# Patient Record
Sex: Male | Born: 1955 | Hispanic: No | Smoking: Current every day smoker
Health system: Southern US, Community
[De-identification: ages and names within clinical notes are randomized; demographics above are authoritative.]

## PROBLEM LIST (undated history)

## (undated) DIAGNOSIS — R55 Syncope and collapse: Secondary | ICD-10-CM

## (undated) HISTORY — DX: Syncope and collapse: R55

## (undated) HISTORY — PX: ANKLE SURGERY: SHX546

## (undated) HISTORY — PX: OTHER SURGICAL HISTORY: SHX169

## (undated) HISTORY — PX: CARPAL TUNNEL RELEASE: SHX101

---

## 1977-11-30 HISTORY — PX: KNEE SURGERY: SHX244

## 1993-11-30 HISTORY — PX: NECK SURGERY: SHX720

## 1998-07-02 ENCOUNTER — Ambulatory Visit (HOSPITAL_COMMUNITY): Admission: RE | Admit: 1998-07-02 | Discharge: 1998-07-02 | Payer: Self-pay | Admitting: Gastroenterology

## 1999-03-31 ENCOUNTER — Ambulatory Visit (HOSPITAL_BASED_OUTPATIENT_CLINIC_OR_DEPARTMENT_OTHER): Admission: RE | Admit: 1999-03-31 | Discharge: 1999-03-31 | Payer: Self-pay | Admitting: Orthopedic Surgery

## 1999-04-07 ENCOUNTER — Ambulatory Visit (HOSPITAL_COMMUNITY): Admission: RE | Admit: 1999-04-07 | Discharge: 1999-04-07 | Payer: Self-pay | Admitting: Orthopedic Surgery

## 2001-01-27 ENCOUNTER — Other Ambulatory Visit (HOSPITAL_COMMUNITY): Admission: RE | Admit: 2001-01-27 | Discharge: 2001-02-18 | Payer: Self-pay | Admitting: Psychiatry

## 2002-12-29 ENCOUNTER — Ambulatory Visit (HOSPITAL_COMMUNITY): Admission: RE | Admit: 2002-12-29 | Discharge: 2002-12-29 | Payer: Self-pay | Admitting: General Surgery

## 2002-12-29 ENCOUNTER — Encounter: Payer: Self-pay | Admitting: General Surgery

## 2004-04-26 ENCOUNTER — Observation Stay (HOSPITAL_COMMUNITY): Admission: EM | Admit: 2004-04-26 | Discharge: 2004-04-27 | Payer: Self-pay | Admitting: Emergency Medicine

## 2005-05-25 ENCOUNTER — Ambulatory Visit (HOSPITAL_COMMUNITY): Admission: RE | Admit: 2005-05-25 | Discharge: 2005-05-25 | Payer: Self-pay | Admitting: Specialist

## 2005-08-16 ENCOUNTER — Encounter: Admission: RE | Admit: 2005-08-16 | Discharge: 2005-08-16 | Payer: Self-pay | Admitting: Neurology

## 2005-09-29 ENCOUNTER — Encounter: Admission: RE | Admit: 2005-09-29 | Discharge: 2005-09-29 | Payer: Self-pay | Admitting: Specialist

## 2006-11-19 ENCOUNTER — Ambulatory Visit: Payer: Self-pay | Admitting: Nurse Practitioner

## 2006-11-25 ENCOUNTER — Emergency Department (HOSPITAL_COMMUNITY): Admission: EM | Admit: 2006-11-25 | Discharge: 2006-11-25 | Payer: Self-pay | Admitting: Emergency Medicine

## 2006-11-26 ENCOUNTER — Ambulatory Visit: Payer: Self-pay | Admitting: *Deleted

## 2006-12-07 ENCOUNTER — Ambulatory Visit: Payer: Self-pay | Admitting: Nurse Practitioner

## 2007-03-30 ENCOUNTER — Emergency Department (HOSPITAL_COMMUNITY): Admission: EM | Admit: 2007-03-30 | Discharge: 2007-03-30 | Payer: Self-pay | Admitting: Emergency Medicine

## 2007-08-17 ENCOUNTER — Encounter (INDEPENDENT_AMBULATORY_CARE_PROVIDER_SITE_OTHER): Payer: Self-pay | Admitting: *Deleted

## 2008-01-12 ENCOUNTER — Emergency Department (HOSPITAL_COMMUNITY): Admission: EM | Admit: 2008-01-12 | Discharge: 2008-01-12 | Payer: Self-pay | Admitting: Emergency Medicine

## 2009-02-14 IMAGING — CR DG RIBS BILAT 3V
1 series · 1 of 1 positions shown · non-contrast
Comparison: none

CLINICAL DATA: Fall. 
 CHEST AND BILATERAL RIBS ? 5 VIEW:

[w chest ap]
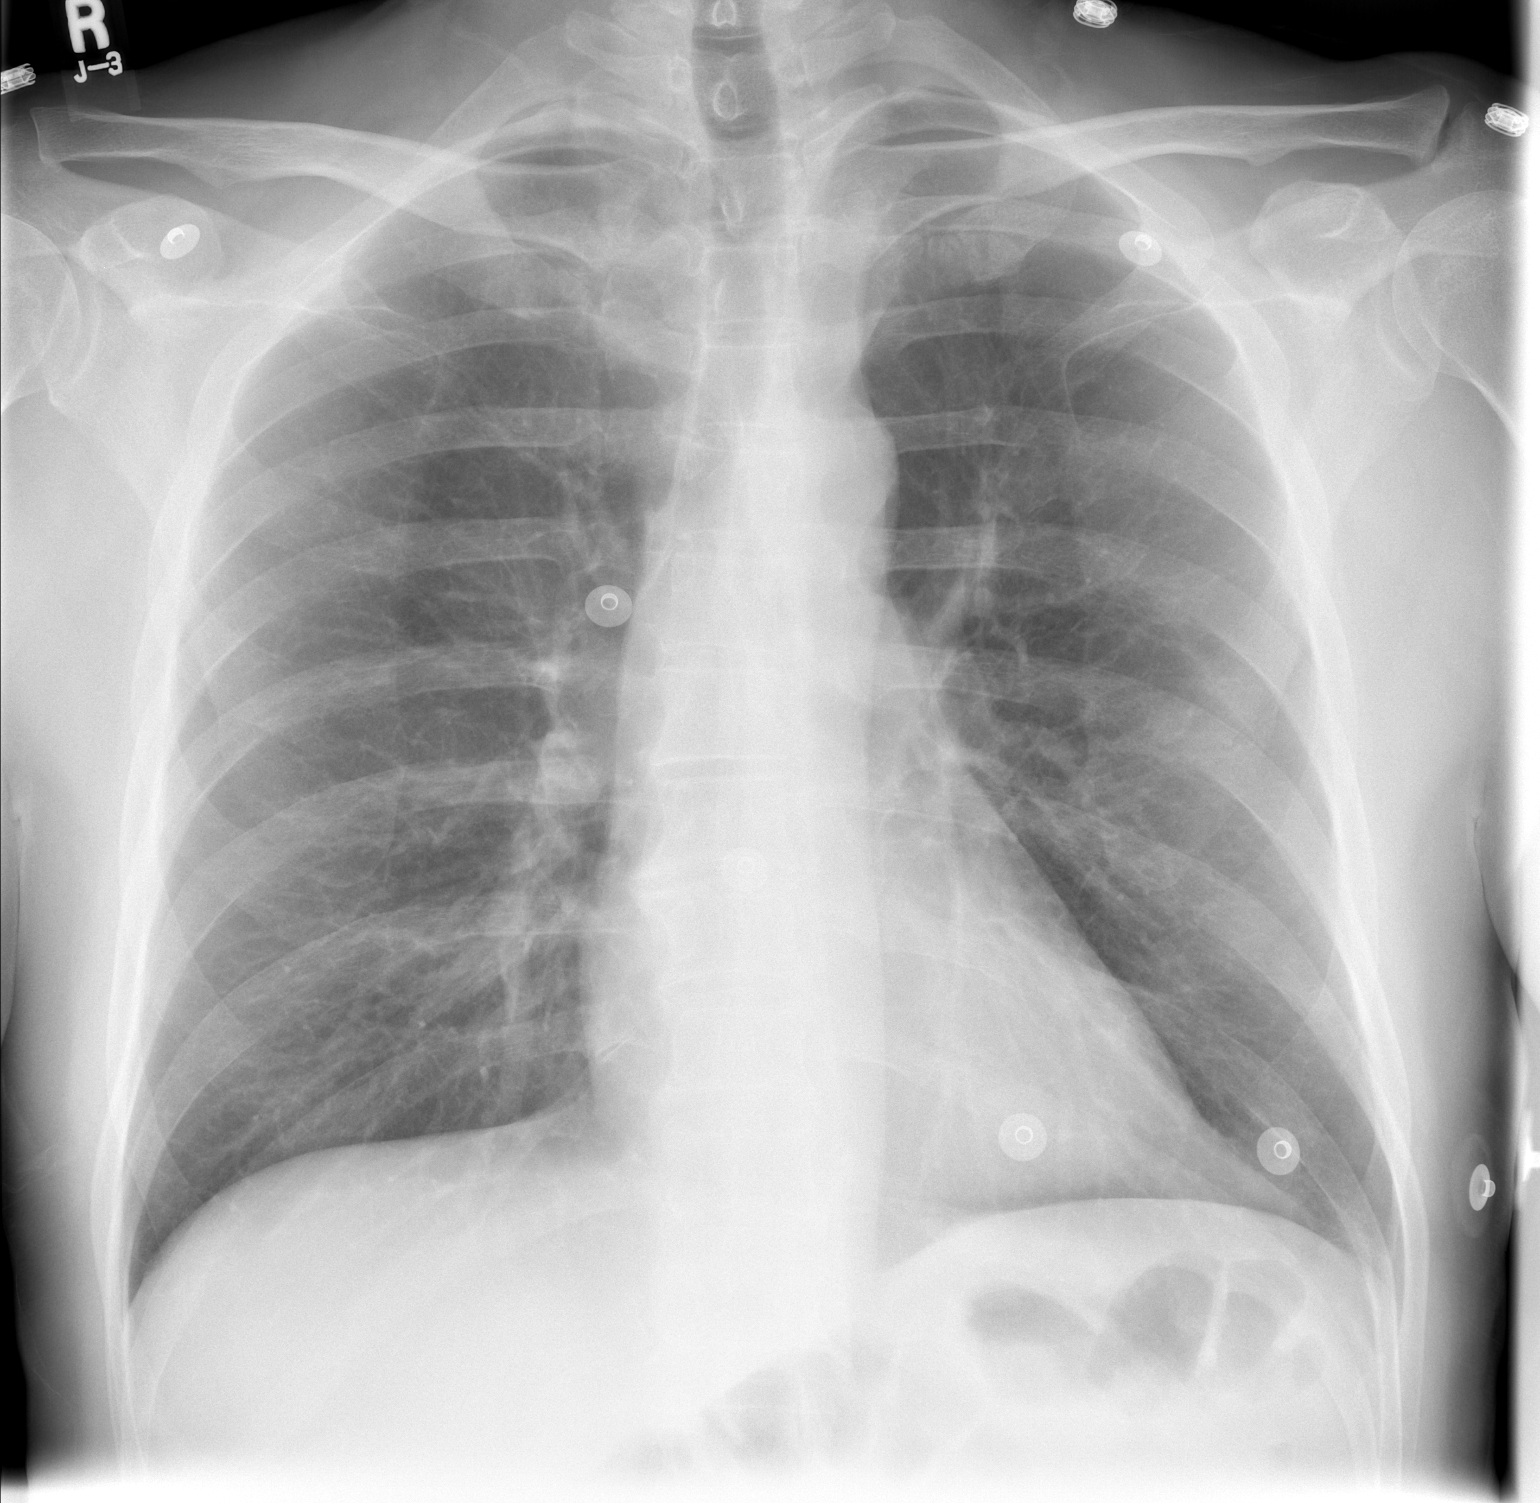

[1 of 1 positions shown; findings below may reference images not displayed]

FINDINGS: Heart size is normal.  There is mild COPD with hyperinflation and mild scarring.  The lungs are clear.  There is no effusion or pneumothorax. 
 Negative for rib fracture bilaterally.
IMPRESSION: 1.  COPD.  No acute abnormality. 
 2.  Negative for rib fracture bilaterally.

## 2009-06-11 ENCOUNTER — Emergency Department (HOSPITAL_COMMUNITY): Admission: EM | Admit: 2009-06-11 | Discharge: 2009-06-11 | Payer: Self-pay | Admitting: Emergency Medicine

## 2010-06-13 ENCOUNTER — Observation Stay (HOSPITAL_COMMUNITY)
Admission: EM | Admit: 2010-06-13 | Discharge: 2010-06-14 | Payer: Self-pay | Source: Home / Self Care | Admitting: Emergency Medicine

## 2011-02-14 LAB — URINALYSIS, ROUTINE W REFLEX MICROSCOPIC
Hgb urine dipstick: NEGATIVE
Nitrite: NEGATIVE
Urobilinogen, UA: 1 mg/dL (ref 0.0–1.0)
pH: 5.5 (ref 5.0–8.0)

## 2011-02-14 LAB — POCT I-STAT, CHEM 8
BUN: 22 mg/dL (ref 6–23)
Calcium, Ion: 1.13 mmol/L (ref 1.12–1.32)
Sodium: 140 mEq/L (ref 135–145)
TCO2: 28 mmol/L (ref 0–100)
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
## 2015-01-03 DIAGNOSIS — M79671 Pain in right foot: Secondary | ICD-10-CM | POA: Diagnosis not present

## 2015-01-03 DIAGNOSIS — Z720 Tobacco use: Secondary | ICD-10-CM | POA: Diagnosis not present

## 2018-03-23 ENCOUNTER — Emergency Department (HOSPITAL_COMMUNITY)
Admission: EM | Admit: 2018-03-23 | Discharge: 2018-03-23 | Disposition: A | Discharge status: Home / Self Care | Payer: Self-pay | Attending: Emergency Medicine | Admitting: Emergency Medicine

## 2018-03-23 ENCOUNTER — Encounter (HOSPITAL_COMMUNITY): Payer: Self-pay

## 2018-03-23 DIAGNOSIS — R531 Weakness: Secondary | ICD-10-CM | POA: Insufficient documentation

## 2018-03-23 DIAGNOSIS — R413 Other amnesia: Secondary | ICD-10-CM | POA: Insufficient documentation

## 2018-03-23 DIAGNOSIS — Z5321 Procedure and treatment not carried out due to patient leaving prior to being seen by health care provider: Secondary | ICD-10-CM | POA: Insufficient documentation

## 2018-03-23 DIAGNOSIS — R55 Syncope and collapse: Secondary | ICD-10-CM | POA: Insufficient documentation

## 2018-03-23 LAB — CBC
HCT: 46.1 % (ref 39.0–52.0)
Hemoglobin: 16.1 g/dL (ref 13.0–17.0)
MCH: 34 pg (ref 26.0–34.0)
MCHC: 34.9 g/dL (ref 30.0–36.0)
MCV: 97.3 fL (ref 78.0–100.0)
PLATELETS: 250 10*3/uL (ref 150–400)
RBC: 4.74 MIL/uL (ref 4.22–5.81)
RDW: 14.1 % (ref 11.5–15.5)
WBC: 8.4 10*3/uL (ref 4.0–10.5)

## 2018-03-23 LAB — URINALYSIS, ROUTINE W REFLEX MICROSCOPIC
BILIRUBIN URINE: NEGATIVE
Bacteria, UA: NONE SEEN
GLUCOSE, UA: NEGATIVE mg/dL
HGB URINE DIPSTICK: NEGATIVE
KETONES UR: NEGATIVE mg/dL
NITRITE: NEGATIVE
PH: 6 (ref 5.0–8.0)
Protein, ur: NEGATIVE mg/dL
SPECIFIC GRAVITY, URINE: 1.016 (ref 1.005–1.030)

## 2018-03-23 LAB — BASIC METABOLIC PANEL
Anion gap: 7 (ref 5–15)
BUN: 10 mg/dL (ref 6–20)
CHLORIDE: 107 mmol/L (ref 101–111)
CO2: 24 mmol/L (ref 22–32)
CREATININE: 0.89 mg/dL (ref 0.61–1.24)
Calcium: 9.5 mg/dL (ref 8.9–10.3)
Glucose, Bld: 111 mg/dL — ABNORMAL HIGH (ref 65–99)
Potassium: 4.1 mmol/L (ref 3.5–5.1)
SODIUM: 138 mmol/L (ref 135–145)

## 2018-03-23 LAB — CBG MONITORING, ED: Glucose-Capillary: 108 mg/dL — ABNORMAL HIGH (ref 65–99)

## 2018-03-23 NOTE — ED Notes (Signed)
CBG 108. 
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
## 2018-03-23 NOTE — ED Notes (Addendum)
This RN noted patient to be yelling at EMT in lobby.  Patient upset about MRI he states his PCP ordered for him.  This RN unable to see order at this time.  Explained to patient he is waiting for a doctor to assess him in order to be able to order an MRI (if appropriate).  Patient irate, yelling at staff.  This RN asked patient time to review his chart.  Labs, chief complaint and vitals assessed.  Encouraged patient to state.  Security having to be called at this point due to patient;'s disposition and being in staff member's faces.  Patient then states "f*ck y'all" and states he is choosing to leave.

## 2018-03-31 ENCOUNTER — Encounter: Payer: Self-pay | Admitting: Neurology

## 2018-03-31 ENCOUNTER — Ambulatory Visit: Payer: Medicare PPO | Admitting: Neurology

## 2018-03-31 VITALS — BP 135/90 | HR 85 | Ht 73.0 in | Wt 176.2 lb

## 2018-03-31 DIAGNOSIS — R410 Disorientation, unspecified: Secondary | ICD-10-CM

## 2018-03-31 DIAGNOSIS — R55 Syncope and collapse: Secondary | ICD-10-CM | POA: Diagnosis not present

## 2018-03-31 DIAGNOSIS — R2990 Unspecified symptoms and signs involving the nervous system: Secondary | ICD-10-CM | POA: Diagnosis not present

## 2018-03-31 DIAGNOSIS — R404 Transient alteration of awareness: Secondary | ICD-10-CM | POA: Diagnosis not present

## 2018-03-31 DIAGNOSIS — R4701 Aphasia: Secondary | ICD-10-CM
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
## 2018-03-31 NOTE — Patient Instructions (Addendum)
MRI brain  EEG to evaluate for seizures Extensive Labwork

## 2018-04-04 ENCOUNTER — Telehealth: Payer: Self-pay | Admitting: Neurology

## 2018-04-04 LAB — HIV ANTIBODY (ROUTINE TESTING W REFLEX): HIV Screen 4th Generation wRfx: NONREACTIVE

## 2018-04-04 LAB — MULTIPLE MYELOMA PANEL, SERUM
ALBUMIN/GLOB SERPL: 1.3 (ref 0.7–1.7)
Albumin SerPl Elph-Mcnc: 3.7 g/dL (ref 2.9–4.4)
Alpha 1: 0.3 g/dL (ref 0.0–0.4)
Alpha2 Glob SerPl Elph-Mcnc: 0.8 g/dL (ref 0.4–1.0)
B-Globulin SerPl Elph-Mcnc: 1 g/dL (ref 0.7–1.3)
Gamma Glob SerPl Elph-Mcnc: 0.9 g/dL (ref 0.4–1.8)
Globulin, Total: 3 g/dL (ref 2.2–3.9)
IGM (IMMUNOGLOBULIN M), SRM: 47 mg/dL (ref 20–172)
IgA/Immunoglobulin A, Serum: 159 mg/dL (ref 61–437)
IgG (Immunoglobin G), Serum: 961 mg/dL (ref 700–1600)

## 2018-04-04 LAB — COMPREHENSIVE METABOLIC PANEL
A/G RATIO: 2.2 (ref 1.2–2.2)
ALK PHOS: 94 IU/L (ref 39–117)
ALT: 14 IU/L (ref 0–44)
AST: 15 IU/L (ref 0–40)
Albumin: 4.6 g/dL (ref 3.6–4.8)
BUN / CREAT RATIO: 13 (ref 10–24)
BUN: 12 mg/dL (ref 8–27)
Bilirubin Total: 0.4 mg/dL (ref 0.0–1.2)
CO2: 23 mmol/L (ref 20–29)
Calcium: 9.4 mg/dL (ref 8.6–10.2)
Chloride: 101 mmol/L (ref 96–106)
Creatinine, Ser: 0.93 mg/dL (ref 0.76–1.27)
GFR calc Af Amer: 101 mL/min/{1.73_m2} (ref >59)
GFR, EST NON AFRICAN AMERICAN: 88 mL/min/{1.73_m2} (ref >59)
GLUCOSE: 102 mg/dL — AB (ref 65–99)
Globulin, Total: 2.1 g/dL (ref 1.5–4.5)
POTASSIUM: 4 mmol/L (ref 3.5–5.2)
SODIUM: 140 mmol/L (ref 134–144)
Total Protein: 6.7 g/dL (ref 6.0–8.5)

## 2018-04-04 LAB — METHYLMALONIC ACID, SERUM: Methylmalonic Acid: 355 nmol/L (ref 0–378)

## 2018-04-04 LAB — HEMOGLOBIN A1C
Est. average glucose Bld gHb Est-mCnc: 114 mg/dL
Hgb A1c MFr Bld: 5.6 % (ref 4.8–5.6)

## 2018-04-04 LAB — TSH: TSH: 1.2 u[IU]/mL (ref 0.450–4.500)

## 2018-04-04 LAB — B12 AND FOLATE PANEL
Folate: 12.1 ng/mL (ref >3.0)
VITAMIN B 12: 242 pg/mL (ref 232–1245)

## 2018-04-04 LAB — HEPATITIS C ANTIBODY: Hep C Virus Ab: <0.1 s/co ratio (ref 0.0–0.9)

## 2018-04-04 LAB — VITAMIN B1: THIAMINE: 133 nmol/L (ref 66.5–200.0)

## 2018-04-04 LAB — B. BURGDORFI ANTIBODIES: Lyme IgG/IgM Ab: <0.91 {ISR} (ref 0.00–0.90)

## 2018-04-04 LAB — SEDIMENTATION RATE: SED RATE: 6 mm/h (ref 0–30)

## 2018-04-04 LAB — VITAMIN B6: Vitamin B6: 4.5 ug/L — ABNORMAL LOW (ref 5.3–46.7)

## 2018-04-04 LAB — HEAVY METALS, BLOOD
Arsenic: 8 ug/L (ref 2–23)
LEAD, BLOOD: 5 ug/dL — AB (ref 0–4)
Mercury: NOT DETECTED ug/L (ref 0.0–14.9)

## 2018-04-04 LAB — RPR: RPR: NONREACTIVE

## 2018-04-04 NOTE — Telephone Encounter (Signed)
Walter Lawrence: [id number] (exp. 04/04/18 to 05/04/18) order sent to GI. They will reach out to the pt to schedule.

## 2018-04-06 ENCOUNTER — Telehealth: Payer: Self-pay | Admitting: Neurology

## 2018-04-06 NOTE — Telephone Encounter (Signed)
Called the pt to review lab work. No answer. LVM for the pt to call back.

## 2018-04-06 NOTE — Telephone Encounter (Signed)
-----   Message from Anson Fret, MD sent at 04/05/2018  4:54 PM EDT ----- His B12 is technically normal but on the low end and his b6 is slightly low as well. I recommend taking a daily multi B vitamin, anything over the counter is fine

## 2018-04-07 NOTE — Telephone Encounter (Signed)
Pt returned call and I was able to review the results with him. Pt instructed to take a multi B vitamin. Pt verbalized understanding. Pt had no questions at this time but was encouraged to call back if questions arise.

## 2018-04-08 ENCOUNTER — Ambulatory Visit
Admission: RE | Admit: 2018-04-08 | Discharge: 2018-04-08 | Disposition: A | Discharge status: Home / Self Care | Payer: Medicare PPO | Source: Ambulatory Visit | Attending: Neurology | Admitting: Neurology

## 2018-04-08 DIAGNOSIS — R404 Transient alteration of awareness: Secondary | ICD-10-CM

## 2018-04-08 DIAGNOSIS — R55 Syncope and collapse: Secondary | ICD-10-CM

## 2018-04-08 DIAGNOSIS — R410 Disorientation, unspecified: Secondary | ICD-10-CM

## 2018-04-08 DIAGNOSIS — R4701 Aphasia: Secondary | ICD-10-CM

## 2018-04-08 MED ORDER — GADOBENATE DIMEGLUMINE 529 MG/ML IV SOLN
17.0000 mL | Freq: Once | INTRAVENOUS | Status: AC | PRN
  Administered 2018-04-08: 17 mL via INTRAVENOUS

## 2018-04-12 ENCOUNTER — Telehealth: Payer: Self-pay | Admitting: *Deleted

## 2018-04-12 NOTE — Telephone Encounter (Addendum)
Spoke with patient. Discussed that MRI of the brain is normal. Incidentally seen is chronic sinusitis. Patient was aware of the sinusitis and stated he has had trouble with that for years ever since a dentist pulled a tooth and had a complication with his sinuses. He verbalized appreciation for the call.    ----- Message from Anson Fret, MD sent at 04/11/2018 12:02 PM EDT ----- MRI of the brain normal. Incidentally seen is chronic sinusitis.

## 2018-05-02 ENCOUNTER — Other Ambulatory Visit: Payer: Medicare PPO

## 2018-05-19 ENCOUNTER — Telehealth: Payer: Self-pay

## 2018-05-19 NOTE — Telephone Encounter (Signed)
Received a message from our phone room that [facility] needs additional diagnosis codes, other than the ones provided when pt had his lab work. I asked our phone staff to please have [facility] fax us the form that requests additional diagnosis codes, and we will need a physician to review this form and add additional codes, if applicable.

## 2018-07-04 ENCOUNTER — Ambulatory Visit (INDEPENDENT_AMBULATORY_CARE_PROVIDER_SITE_OTHER): Payer: Medicare PPO | Admitting: Neurology

## 2018-07-04 ENCOUNTER — Encounter: Payer: Self-pay | Admitting: Neurology

## 2018-07-04 VITALS — BP 135/91 | HR 60 | Ht 73.0 in | Wt 179.0 lb

## 2018-07-04 DIAGNOSIS — F1290 Cannabis use, unspecified, uncomplicated: Secondary | ICD-10-CM

## 2018-07-04 DIAGNOSIS — R2990 Unspecified symptoms and signs involving the nervous system: Secondary | ICD-10-CM

## 2018-07-04 NOTE — Progress Notes (Signed)
Patient comes in today, he feels better, decline eeg and other testing. Will cancel appointment.

## 2020-08-14 ENCOUNTER — Emergency Department (HOSPITAL_BASED_OUTPATIENT_CLINIC_OR_DEPARTMENT_OTHER)
Admission: EM | Admit: 2020-08-14 | Discharge: 2020-08-14 | Disposition: A | Discharge status: Home / Self Care | Payer: Medicare PPO | Attending: Emergency Medicine | Admitting: Emergency Medicine

## 2020-08-14 ENCOUNTER — Encounter (HOSPITAL_BASED_OUTPATIENT_CLINIC_OR_DEPARTMENT_OTHER): Payer: Self-pay | Admitting: Emergency Medicine

## 2020-08-14 ENCOUNTER — Emergency Department (HOSPITAL_BASED_OUTPATIENT_CLINIC_OR_DEPARTMENT_OTHER): Payer: Medicare PPO

## 2020-08-14 ENCOUNTER — Other Ambulatory Visit: Payer: Self-pay

## 2020-08-14 DIAGNOSIS — M25551 Pain in right hip: Secondary | ICD-10-CM | POA: Insufficient documentation

## 2020-08-14 DIAGNOSIS — F17210 Nicotine dependence, cigarettes, uncomplicated: Secondary | ICD-10-CM | POA: Diagnosis not present

## 2020-08-14 LAB — BASIC METABOLIC PANEL
Anion gap: 7 (ref 5–15)
BUN: 14 mg/dL (ref 8–23)
CO2: 23 mmol/L (ref 22–32)
Calcium: 8.9 mg/dL (ref 8.9–10.3)
Chloride: 103 mmol/L (ref 98–111)
Creatinine, Ser: 0.72 mg/dL (ref 0.61–1.24)
GFR calc Af Amer: >60 mL/min (ref >60)
GFR calc non Af Amer: >60 mL/min (ref >60)
Glucose, Bld: 105 mg/dL — ABNORMAL HIGH (ref 70–99)
Potassium: 3.7 mmol/L (ref 3.5–5.1)
Sodium: 133 mmol/L — ABNORMAL LOW (ref 135–145)

## 2020-08-14 LAB — CBC
HCT: 45.7 % (ref 39.0–52.0)
Hemoglobin: 15.8 g/dL (ref 13.0–17.0)
MCH: 33.3 pg (ref 26.0–34.0)
MCHC: 34.6 g/dL (ref 30.0–36.0)
MCV: 96.2 fL (ref 80.0–100.0)
Platelets: 280 10*3/uL (ref 150–400)
RBC: 4.75 MIL/uL (ref 4.22–5.81)
RDW: 13.3 % (ref 11.5–15.5)
WBC: 8.1 10*3/uL (ref 4.0–10.5)
nRBC: 0 % (ref 0.0–0.2)

## 2020-08-14 MED ORDER — PREDNISONE 10 MG (21) PO TBPK: ORAL_TABLET | Freq: Every day | ORAL | 0 refills | Status: AC

## 2020-08-14 NOTE — Discharge Instructions (Signed)
You are seen today for hip pain, this is most likely from a pinched nerve as we spoke about.  I want you to take the prednisone as prescribed.  I also want you to take NSAIDs or Tylenol for the pain as prescribed on the bottle.  This can include ibuprofen or naproxen.  Get plenty of rest and stay hydrated.  Please follow-up with Dr. Jordan Likes who is a sports medicine doctor.  Please talk to your pharmacist about any new medications I prescribed today including side effects or interactions with current medications. I hope you feel better!
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
## 2020-08-14 NOTE — ED Triage Notes (Signed)
Pt here with right leg pain and spasms.

## 2020-10-22 ENCOUNTER — Emergency Department (HOSPITAL_COMMUNITY): Payer: Medicare PPO | Admitting: Registered Nurse

## 2020-10-22 ENCOUNTER — Emergency Department (HOSPITAL_BASED_OUTPATIENT_CLINIC_OR_DEPARTMENT_OTHER): Payer: Medicare PPO

## 2020-10-22 ENCOUNTER — Encounter (HOSPITAL_COMMUNITY)
Admission: EM | Disposition: A | Discharge status: Home / Self Care | Payer: Self-pay | Source: Home / Self Care | Attending: Emergency Medicine

## 2020-10-22 ENCOUNTER — Other Ambulatory Visit: Payer: Self-pay

## 2020-10-22 ENCOUNTER — Emergency Department (HOSPITAL_BASED_OUTPATIENT_CLINIC_OR_DEPARTMENT_OTHER)
Admission: EM | Admit: 2020-10-22 | Discharge: 2020-10-22 | Disposition: A | Discharge status: Home / Self Care | Payer: Medicare PPO | Attending: Gastroenterology | Admitting: Gastroenterology

## 2020-10-22 ENCOUNTER — Encounter (HOSPITAL_BASED_OUTPATIENT_CLINIC_OR_DEPARTMENT_OTHER): Payer: Self-pay

## 2020-10-22 DIAGNOSIS — Z888 Allergy status to other drugs, medicaments and biological substances status: Secondary | ICD-10-CM | POA: Insufficient documentation

## 2020-10-22 DIAGNOSIS — R102 Pelvic and perineal pain: Secondary | ICD-10-CM

## 2020-10-22 DIAGNOSIS — F17210 Nicotine dependence, cigarettes, uncomplicated: Secondary | ICD-10-CM | POA: Diagnosis not present

## 2020-10-22 DIAGNOSIS — X58XXXA Exposure to other specified factors, initial encounter: Secondary | ICD-10-CM | POA: Diagnosis not present

## 2020-10-22 DIAGNOSIS — Z8249 Family history of ischemic heart disease and other diseases of the circulatory system: Secondary | ICD-10-CM | POA: Insufficient documentation

## 2020-10-22 DIAGNOSIS — Z20822 Contact with and (suspected) exposure to covid-19: Secondary | ICD-10-CM | POA: Insufficient documentation

## 2020-10-22 DIAGNOSIS — K649 Unspecified hemorrhoids: Secondary | ICD-10-CM | POA: Diagnosis not present

## 2020-10-22 DIAGNOSIS — K635 Polyp of colon: Secondary | ICD-10-CM | POA: Insufficient documentation

## 2020-10-22 DIAGNOSIS — K6289 Other specified diseases of anus and rectum: Secondary | ICD-10-CM | POA: Diagnosis not present

## 2020-10-22 DIAGNOSIS — Z91041 Radiographic dye allergy status: Secondary | ICD-10-CM | POA: Insufficient documentation

## 2020-10-22 DIAGNOSIS — R935 Abnormal findings on diagnostic imaging of other abdominal regions, including retroperitoneum: Secondary | ICD-10-CM | POA: Diagnosis not present

## 2020-10-22 DIAGNOSIS — T185XXA Foreign body in anus and rectum, initial encounter: Secondary | ICD-10-CM | POA: Insufficient documentation

## 2020-10-22 DIAGNOSIS — Z833 Family history of diabetes mellitus: Secondary | ICD-10-CM | POA: Diagnosis not present

## 2020-10-22 DIAGNOSIS — E876 Hypokalemia: Secondary | ICD-10-CM | POA: Insufficient documentation

## 2020-10-22 DIAGNOSIS — Z7952 Long term (current) use of systemic steroids: Secondary | ICD-10-CM | POA: Diagnosis not present

## 2020-10-22 DIAGNOSIS — Z91013 Allergy to seafood: Secondary | ICD-10-CM | POA: Diagnosis not present

## 2020-10-22 HISTORY — PX: FLEXIBLE SIGMOIDOSCOPY: SHX5431

## 2020-10-22 HISTORY — PX: FOREIGN BODY REMOVAL: SHX962

## 2020-10-22 LAB — CBC
HCT: 44.2 % (ref 39.0–52.0)
Hemoglobin: 15.5 g/dL (ref 13.0–17.0)
MCH: 33.8 pg (ref 26.0–34.0)
MCHC: 35.1 g/dL (ref 30.0–36.0)
MCV: 96.3 fL (ref 80.0–100.0)
Platelets: 253 10*3/uL (ref 150–400)
RBC: 4.59 MIL/uL (ref 4.22–5.81)
RDW: 13.4 % (ref 11.5–15.5)
WBC: 8.1 10*3/uL (ref 4.0–10.5)
nRBC: 0 % (ref 0.0–0.2)

## 2020-10-22 LAB — BASIC METABOLIC PANEL
Anion gap: 11 (ref 5–15)
BUN: 10 mg/dL (ref 8–23)
CO2: 23 mmol/L (ref 22–32)
Calcium: 9.1 mg/dL (ref 8.9–10.3)
Chloride: 104 mmol/L (ref 98–111)
Creatinine, Ser: 0.61 mg/dL (ref 0.61–1.24)
GFR, Estimated: >60 mL/min (ref >60)
Glucose, Bld: 110 mg/dL — ABNORMAL HIGH (ref 70–99)
Potassium: 3.4 mmol/L — ABNORMAL LOW (ref 3.5–5.1)
Sodium: 138 mmol/L (ref 135–145)

## 2020-10-22 LAB — RESP PANEL BY RT-PCR (FLU A&B, COVID) ARPGX2
Influenza A by PCR: NEGATIVE
Influenza B by PCR: NEGATIVE
SARS Coronavirus 2 by RT PCR: NEGATIVE

## 2020-10-22 SURGERY — SIGMOIDOSCOPY, FLEXIBLE
Anesthesia: Monitor Anesthesia Care

## 2020-10-22 MED ORDER — ONDANSETRON HCL 4 MG/2ML IJ SOLN: 4.0000 mg | Freq: Once | INTRAMUSCULAR | Status: DC | PRN

## 2020-10-22 MED ORDER — AMISULPRIDE (ANTIEMETIC) 5 MG/2ML IV SOLN
10.0000 mg | Freq: Once | INTRAVENOUS | Status: DC | PRN
  Filled 2020-10-22: qty 4

## 2020-10-22 MED ORDER — LACTATED RINGERS IV SOLN: INTRAVENOUS | Status: DC | PRN

## 2020-10-22 MED ORDER — ONDANSETRON HCL 4 MG/2ML IJ SOLN
INTRAMUSCULAR | Status: DC | PRN
  Administered 2020-10-22: 4 mg via INTRAVENOUS

## 2020-10-22 MED ORDER — FENTANYL CITRATE (PF) 100 MCG/2ML IJ SOLN
INTRAMUSCULAR | Status: AC
  Filled 2020-10-22: qty 2

## 2020-10-22 MED ORDER — BISACODYL 10 MG RE SUPP
10.0000 mg | Freq: Once | RECTAL | Status: AC
  Administered 2020-10-22: 10 mg via RECTAL
  Filled 2020-10-22: qty 1

## 2020-10-22 MED ORDER — FENTANYL CITRATE (PF) 100 MCG/2ML IJ SOLN
INTRAMUSCULAR | Status: DC | PRN
  Administered 2020-10-22: 100 ug via INTRAVENOUS

## 2020-10-22 MED ORDER — SODIUM CHLORIDE 0.9 % IV SOLN: INTRAVENOUS | Status: DC

## 2020-10-22 MED ORDER — PROPOFOL 500 MG/50ML IV EMUL
INTRAVENOUS | Status: DC | PRN
  Administered 2020-10-22 (×3): 50 mg via INTRAVENOUS

## 2020-10-22 MED ORDER — PROPOFOL 500 MG/50ML IV EMUL
INTRAVENOUS | Status: DC | PRN
  Administered 2020-10-22: 150 ug/kg/min via INTRAVENOUS

## 2020-10-22 MED ORDER — OXYCODONE HCL 5 MG PO TABS
5.0000 mg | ORAL_TABLET | Freq: Once | ORAL | Status: DC | PRN
  Filled 2020-10-22: qty 1

## 2020-10-22 MED ORDER — PROPOFOL 10 MG/ML IV BOLUS
INTRAVENOUS | Status: AC
  Filled 2020-10-22: qty 20

## 2020-10-22 MED ORDER — LACTATED RINGERS IV SOLN: INTRAVENOUS | Status: DC

## 2020-10-22 MED ORDER — LIDOCAINE HCL URETHRAL/MUCOSAL 2 % EX GEL
CUTANEOUS | Status: AC
  Filled 2020-10-22: qty 30

## 2020-10-22 MED ORDER — FENTANYL CITRATE (PF) 100 MCG/2ML IJ SOLN: 25.0000 ug | INTRAMUSCULAR | Status: DC | PRN

## 2020-10-22 MED ORDER — MAGNESIUM CITRATE PO SOLN
1.0000 | Freq: Once | ORAL | Status: AC
  Administered 2020-10-22: 1 via ORAL
  Filled 2020-10-22: qty 296

## 2020-10-22 MED ORDER — OXYCODONE HCL 5 MG/5ML PO SOLN
5.0000 mg | Freq: Once | ORAL | Status: DC | PRN
  Filled 2020-10-22: qty 5

## 2020-10-22 MED ORDER — MIDAZOLAM HCL 2 MG/2ML IJ SOLN
INTRAMUSCULAR | Status: AC
  Filled 2020-10-22: qty 2

## 2020-10-22 NOTE — ED Notes (Signed)
Pt attempted to have BM, states was only able to get out a "few drops". Pain in rectum worsening, states he does not want pain meds.

## 2020-10-22 NOTE — Anesthesia Preprocedure Evaluation (Signed)
Anesthesia Evaluation  Patient identified by MRN, date of birth, ID band Patient awake    Reviewed: Allergy & Precautions, NPO status , Patient's Chart, lab work & pertinent test results  History of Anesthesia Complications Negative for: history of anesthetic complications  Airway Mallampati: II  TM Distance: >3 FB Neck ROM: Full    Dental   Pulmonary Current Smoker and Patient abstained from smoking.,    Pulmonary exam normal        Cardiovascular negative cardio ROS Normal cardiovascular exam     Neuro/Psych negative neurological ROS  negative psych ROS   GI/Hepatic Neg liver ROS, Foreign object in rectum   Endo/Other  negative endocrine ROS  Renal/GU negative Renal ROS  negative genitourinary   Musculoskeletal negative musculoskeletal ROS (+)   Abdominal   Peds  Hematology negative hematology ROS (+)   Anesthesia Other Findings   Reproductive/Obstetrics                             Anesthesia Physical Anesthesia Plan  ASA: II and emergent  Anesthesia Plan: MAC   Post-op Pain Management:    Induction: Intravenous  PONV Risk Score and Plan: 0 and Propofol infusion, TIVA and Treatment may vary due to age or medical condition  Airway Management Planned: Natural Airway, Nasal Cannula and Simple Face Mask  Additional Equipment: None  Intra-op Plan:   Post-operative Plan:   Informed Consent: I have reviewed the patients History and Physical, chart, labs and discussed the procedure including the risks, benefits and alternatives for the proposed anesthesia with the patient or authorized representative who has indicated his/her understanding and acceptance.       Plan Discussed with:   Anesthesia Plan Comments:         Anesthesia Quick Evaluation
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
## 2020-10-22 NOTE — Op Note (Signed)
[facility] Patient Name: Walter Lawrence Procedure Date: 10/22/2020 MRN: [id number] Attending MD: Justice Britain , MD Date of Birth: 1956/08/23 CSN: [id number] Age: 64 Admit Type: Outpatient Procedure:                Flexible Sigmoidoscopy Indications:              Foreign body in the rectum, Lower abdominal pain,                            Rectal pain Providers:                Justice Britain, MD, Wynonia Sours, RN, William Dalton, Technician, Benetta Spar, Technician Referring MD:             Gerrit Heck, MD, [facility] Team, Earnstine Regal Medicines:                Monitored Anesthesia Care Complications:            No immediate complications. Estimated Blood Loss:     Estimated blood loss was minimal. Procedure:                Pre-Anesthesia Assessment:                           - Prior to the procedure, a History and Physical                            was performed, and patient medications and                            allergies were reviewed. The patient's tolerance of                            previous anesthesia was also reviewed. The risks                            and benefits of the procedure and the sedation                            options and risks were discussed with the patient.                            All questions were answered, and informed consent                            was obtained. Prior Anticoagulants: The patient has                            taken no previous anticoagulant or antiplatelet                            agents. ASA Grade Assessment: II - A patient with  mild systemic disease. After reviewing the risks                            and benefits, the patient was deemed in                            satisfactory condition to undergo the procedure.                           After obtaining informed consent, the scope was                            passed under  direct vision. The GIF-H190 ([id number])                            Olympus gastroscope was introduced through the anus                            and advanced to the the sigmoid colon. The flexible                            sigmoidoscopy was somewhat difficult. Successful                            completion of the procedure was aided by performing                            the maneuvers documented (below) in this report.                            The patient tolerated the procedure. The quality of                            the bowel preparation was inadequate. Scope In: 9:11:51 PM Scope Out: 9:12:20 PM Total Procedure Duration: 0 hours 0 minutes 29 seconds  Findings:      Hemorrhoids were found on perianal exam.      The digital rectal exam findings include palpable rectal foreign object       that was partially soft in setting of some mild foam material. Using my       hand/digits I was able to grab the distal end of the object and was able       to pull it out (approximately 20 cm in length).      After the foreign body was removed, the endoscope showed evidence of       diffuse moderate mucosal changes characterized by congestion (edema),       erythema, friability and granularity were found in the rectum extending       to the recto-sigmoid colon.      A 10 mm polyp was found in the sigmoid colon. The polyp was       semi-pedunculated. Not removed due to inadequate preparation and not       intention of procedure.      Extensive amounts of semi-solid solid stool was found in the       recto-sigmoid  colon and in the sigmoid colon, interfering with       visualization.      Retroflexion not performed due to inflammation in setting of foreign       body that had been in place. Impression:               - Preparation of the colon was inadequate for                            screening procedures.                           - Hemorrhoids found on perianal exam.                            - Palpable rectal foreign body found on digital                            rectal exam. Able to be removed using digits.                           - Diffuse moderate mucosal changes were found in                            the rectum and in the recto-sigmoid colon                            consistent with trauma from the previously inserted                            foreign object.                           - One 10 mm polyp in the sigmoid colon. Not removed                            but will need to (adenomatous appearing) since not                            intention or preparation for today's procedure.                           - Stool in the recto-sigmoid colon and in the                            sigmoid colon.                           - The foreign body will be packaged and returned to                            the patient as his belongings. Moderate Sedation:      Not Applicable - Patient had care per Anesthesia. Recommendation:           - The patient will be observed post-procedure,  until all discharge criteria are met.                           - Transport patient to ER.                           - Patient has a contact number available for                            emergencies. The signs and symptoms of potential                            delayed complications were discussed with the                            patient. Return to normal activities tomorrow.                            Written discharge instructions were provided to the                            patient.                           - Observe the patient in PACU and send back to ER                            for eventual discharge based on their evaluation.                           - If patient has pain/discomfort in rectal region                            then may use Rectacare PRN.                           - Colace 200 mg daily should be initiated.                            - Miralax daily until patient has complete                            evacuation.                           - Recommend no further insertion of foreign objects                            as may increase risk of anal fissures or                            progressive incontinence in future.                           - Patient needs colonoscopy within next 2-6 months  to perform colon cancer screening and polyp                            removal. We will work on arranging this.                           - The findings and recommendations were discussed                            with the patient.                           - The findings and recommendations were discussed                            with the referring ED team. Procedure Code(s):        --- Professional ---                           [phone], Sigmoidoscopy, flexible; diagnostic,                            including collection of specimen(s) by brushing or                            washing, when performed (separate procedure) Diagnosis Code(s):        --- Professional ---                           K64.9, Unspecified hemorrhoids                           K62.89, Other specified diseases of anus and rectum                           K63.89, Other specified diseases of intestine                           K63.5, Polyp of colon                           T18.5XXA, Foreign body in anus and rectum, initial                            encounter                           R10.30, Lower abdominal pain, unspecified CPT copyright 2019 American Medical Association. All rights reserved. The codes documented in this report are preliminary and upon coder review may  be revised to meet current compliance requirements. Justice Britain, MD 10/22/2020 9:34:05 PM Number of Addenda: 0
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
## 2020-10-22 NOTE — ED Provider Notes (Signed)
Pt with rectal foreign body.  D/w Noel Gerold, NP on call for [facility], Dr. Barron Alvine - recommends ED to ED transfer to [facility] for Endoscopy.  Patient is in agreement with plan.  D/w Dr. Charm Barges at [facility], who accepts the patient in transfer.    Tilden Fossa, MD 10/22/20 [phone]

## 2020-10-22 NOTE — ED Provider Notes (Signed)
Patient arriving to this ED from [facility] ED for foreign body in rectum.  Patient has some lower abdominal cramping though overall appears to be in no distress.  Consult placed to GI to be made aware of patient's arrival.  GI and Surgery to discuss plan of care.  Patient transported to endoscopy.   Elbert Spickler, [place] N, PA-C 10/22/20 2142    Sabas Sous, MD 10/23/20 [phone]
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
## 2020-10-22 NOTE — Transfer of Care (Signed)
Immediate Anesthesia Transfer of Care Note  Patient: Walter Lawrence  Procedure(s) Performed: FLEXIBLE SIGMOIDOSCOPY (N/A )  Patient Location: PACU  Anesthesia Type:MAC  Level of Consciousness: awake, alert , oriented and patient cooperative  Airway & Oxygen Therapy: Patient Spontanous Breathing and Patient connected to face mask oxygen  Post-op Assessment: Report given to RN, Post -op Vital signs reviewed and stable and Patient moving all extremities X 4  Post vital signs: stable  Last Vitals:  Vitals Value Taken Time  BP 97/71 10/22/20 2130  Temp    Pulse 72 10/22/20 2133  Resp 10 10/22/20 2133  SpO2 98 % 10/22/20 2133  Vitals shown include unvalidated device data.  Last Pain:  Vitals:   10/22/20 2035  TempSrc: Temporal  PainSc: 7       Patients Stated Pain Goal: 0 (50/09/38 1829)  Complications: No complications documented.

## 2020-10-22 NOTE — ED Notes (Signed)
Pt on bedside commode attempting to have BM

## 2020-10-22 NOTE — Anesthesia Procedure Notes (Signed)
Procedure Name: MAC Date/Time: 10/22/2020 8:59 PM Performed by: Lissa Morales, CRNA Pre-anesthesia Checklist: Patient identified, Emergency Drugs available, Suction available, Timeout performed and Patient being monitored Patient Re-evaluated:Patient Re-evaluated prior to induction Oxygen Delivery Method: Simple face mask Placement Confirmation: positive ETCO2

## 2020-10-22 NOTE — Interval H&P Note (Signed)
History and Physical Interval Note:  10/22/2020 8:34 PM  Walter Lawrence  has presented today for surgery, with the diagnosis of foreign object in rectum.  The various methods of treatment have been discussed with the patient and family. After consideration of risks, benefits and other options for treatment, the patient has consented to  Procedure(s): FLEXIBLE SIGMOIDOSCOPY (N/A) as a surgical intervention.  The patient's history has been reviewed, patient examined, no change in status, stable for surgery.  I have reviewed the patient's chart and labs.  Questions were answered to the patient's satisfaction.     [employer]

## 2020-10-22 NOTE — ED Notes (Signed)
ED Provider at bedside. 

## 2020-10-22 NOTE — ED Notes (Signed)
Patient BIB [facility] from [facility] for object in rectum.

## 2020-10-22 NOTE — ED Notes (Signed)
Pt transported to endoscopy with RN.

## 2020-10-22 NOTE — ED Provider Notes (Signed)
  Provider Note MRN:  [id number]  Arrival date & time: 10/22/20    ED Course and Medical Decision Making  Assumed care from Dr. Roxan Hockey.  Patient has returned from his procedure, received propofol and rectal foreign body removed. He feels well, he has normal vital signs, he is fully alert and awake, feels back to normal. He is appropriate for discharge.  Procedures  Final Clinical Impressions(s) / ED Diagnoses     ICD-10-CM   1. Foreign body in anus and rectum, initial encounter  [id number]     ED Discharge Orders    None        Discharge Instructions     If you experience pain or discomfort in your rectum, you can use RectiCare as needed.  This is found over-the-counter. You should also treat with 200 mg of Colace daily to help maintain soft stools.  Follow with gastroenterology for screening colonoscopy and polyp removal.    Elmer Sow. Pilar Plate, MD [facility] Emergency Medicine [facility] [email or web address]    Sabas Sous, MD 10/22/20 2248

## 2020-10-22 NOTE — Discharge Instructions (Addendum)
If you experience pain or discomfort in your rectum, you can use RectiCare as needed.  This is found over-the-counter. You should also treat with 200 mg of Colace daily to help maintain soft stools.  Follow with gastroenterology for screening colonoscopy and polyp removal.

## 2020-10-23 ENCOUNTER — Telehealth: Payer: Self-pay

## 2020-10-23 ENCOUNTER — Encounter (HOSPITAL_COMMUNITY): Payer: Self-pay | Admitting: Gastroenterology

## 2020-10-23 NOTE — Telephone Encounter (Signed)
Recall for [facility] colon has been entered for no sooner than Feb.

## 2020-10-23 NOTE — Telephone Encounter (Signed)
-----   Message from Lemar Lofty., MD sent at 10/22/2020  9:45 PM EST ----- Walter Lawrence, Patient had foreign body removal this evening per rectum. Has polyps. Needs full colonoscopy within next 13-months.  Can be done in [facility]. No sooner than February. Please reach out tomorrow or early next week to get on the books. Thank you. GM

## 2020-10-28 NOTE — Anesthesia Postprocedure Evaluation (Signed)
Anesthesia Post Note  Patient: Walter Lawrence  Procedure(s) Performed: FLEXIBLE SIGMOIDOSCOPY (N/A ) FOREIGN BODY REMOVAL     Patient location during evaluation: PACU Anesthesia Type: MAC Level of consciousness: awake and alert Pain management: pain level controlled Vital Signs Assessment: post-procedure vital signs reviewed and stable Respiratory status: spontaneous breathing, nonlabored ventilation and respiratory function stable Cardiovascular status: blood pressure returned to baseline and stable Postop Assessment: no apparent nausea or vomiting Anesthetic complications: no   No complications documented.  Last Vitals:  Vitals:   10/22/20 2155 10/22/20 2247  BP: 116/71 140/90  Pulse: 79 80  Resp: 17 14  Temp: 37.1 C 37 C  SpO2: 100% 100%    Last Pain:  Vitals:   10/22/20 2247  TempSrc:   PainSc: 0-No pain   Pain Goal: Patients Stated Pain Goal: 3 (10/22/20 2130)                 Lidia Collum

## 2021-07-01 ENCOUNTER — Encounter: Payer: Self-pay | Admitting: Gastroenterology

## 2022-09-09 IMAGING — CR DG PELVIS 1-2V
1 series · 1 of 1 positions shown · non-contrast
Comparison: June 13, 2020

CLINICAL DATA: Rectal foreign body

EXAM:
PELVIS - 1-2 VIEW

[t pelvis a.p.]
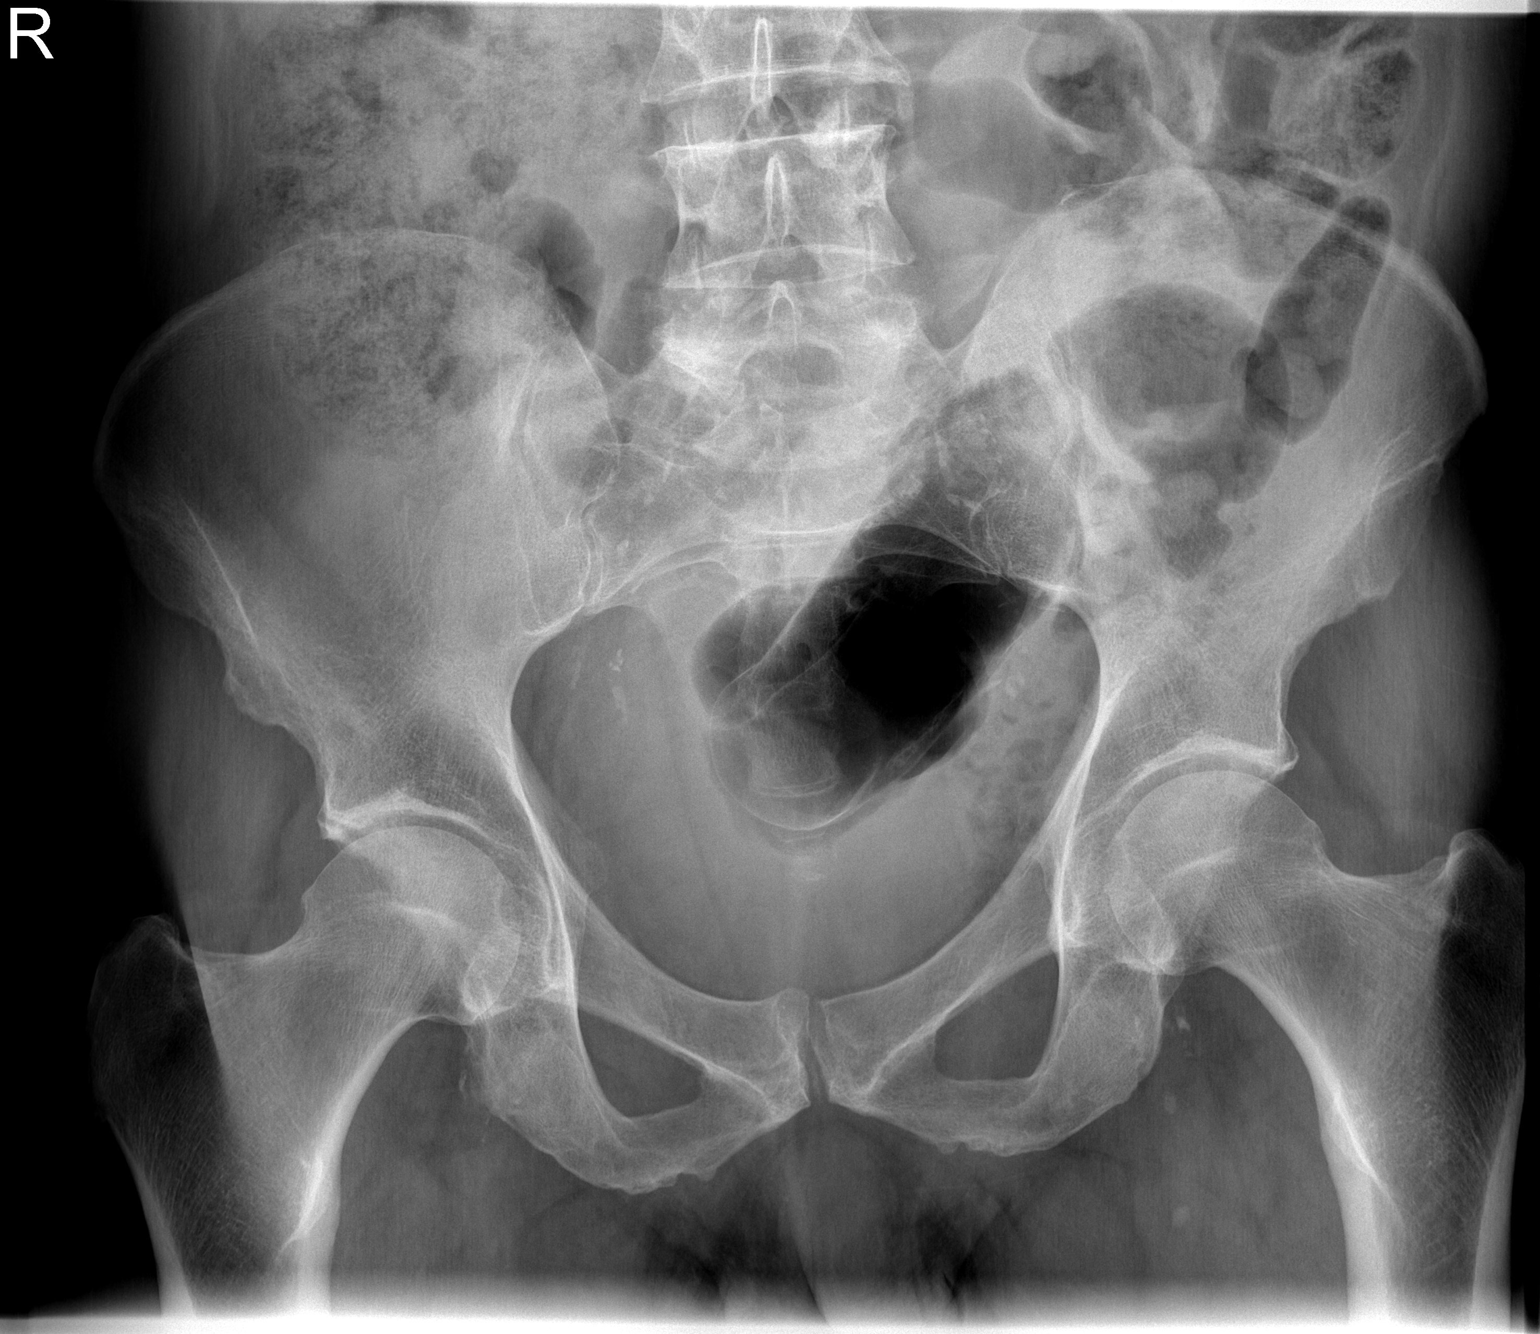

[1 of 1 positions shown; findings below may reference images not displayed]

FINDINGS: There is no radiopaque foreign body. There is air within the distal
sigmoid colon possibly outlining a foreign body. No air within the
rectum.
IMPRESSION: No radiopaque foreign body. No air within the rectum. Air within the
distal sigmoid colon possibly outlining a foreign body.

## 2023-10-17 DIAGNOSIS — R69 Illness, unspecified: Secondary | ICD-10-CM | POA: Diagnosis not present
